# Patient Record
Sex: Male | Born: 1975 | Hispanic: No | Marital: Single | Smoking: Never smoker
Health system: Southern US, Community
[De-identification: ages and names within clinical notes are randomized; demographics above are authoritative.]

## PROBLEM LIST (undated history)

## (undated) DIAGNOSIS — T8859XA Other complications of anesthesia, initial encounter: Secondary | ICD-10-CM

## (undated) DIAGNOSIS — M1990 Unspecified osteoarthritis, unspecified site: Secondary | ICD-10-CM

## (undated) DIAGNOSIS — T4145XA Adverse effect of unspecified anesthetic, initial encounter: Secondary | ICD-10-CM

## (undated) DIAGNOSIS — K219 Gastro-esophageal reflux disease without esophagitis: Secondary | ICD-10-CM

---
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
## 2005-05-07 ENCOUNTER — Emergency Department (HOSPITAL_COMMUNITY): Admission: EM | Admit: 2005-05-07 | Discharge: 2005-05-07 | Payer: Self-pay | Admitting: Emergency Medicine

## 2007-06-18 ENCOUNTER — Emergency Department (HOSPITAL_COMMUNITY): Admission: EM | Admit: 2007-06-18 | Discharge: 2007-06-18 | Payer: Self-pay | Admitting: Emergency Medicine

## 2011-01-18 LAB — CBC
HCT: 48.8
Hemoglobin: 16.9
RBC: 5.99 — ABNORMAL HIGH
WBC: 6.9

## 2011-01-18 LAB — COMPREHENSIVE METABOLIC PANEL
AST: 24
Alkaline Phosphatase: 116
BUN: 8
CO2: 29
Chloride: 101
Creatinine, Ser: 0.9
GFR calc non Af Amer: >60
Potassium: 3.5
Total Bilirubin: 1.1

## 2011-01-18 LAB — DIFFERENTIAL
Eosinophils Relative: 1
Lymphocytes Relative: 15
Lymphs Abs: 1
Monocytes Absolute: 0.6
Neutro Abs: 5.3

## 2011-08-31 ENCOUNTER — Ambulatory Visit: Payer: Self-pay | Admitting: Physician Assistant

## 2011-08-31 VITALS — BP 120/77 | HR 69 | Temp 98.5°F | Resp 18 | Ht 70.5 in | Wt 171.0 lb

## 2011-08-31 DIAGNOSIS — Z0289 Encounter for other administrative examinations: Secondary | ICD-10-CM

## 2011-08-31 NOTE — Progress Notes (Signed)
Patient ID: Jeremiah Doyle MRN: [id number], DOB: 12-09-1975 36 y.o. Date of Encounter: 08/31/2011, 2:59 PM  Primary Physician: No primary provider on file.  Chief Complaint: DOT Physical   HPI: 36 y.o. y/o male with history noted below here for DOT physical. Doing well. No issues/complaints. Recertification. No meds. No significant PMH.   Review of Systems: Consitutional: No fever, chills, fatigue, night sweats, lymphadenopathy, or weight changes. Eyes: No visual changes, eye redness, or discharge. ENT/Mouth: Ears: No otalgia, tinnitus, hearing loss, discharge. Nose: No congestion, rhinorrhea, sinus pain, or epistaxis. Throat: No sore throat, post nasal drip, or teeth pain. Cardiovascular: No CP, palpitations, diaphoresis, DOE, edema, orthopnea, PND. Respiratory: No cough, hemoptysis, SOB, or wheezing. Gastrointestinal: No anorexia, dysphagia, reflux, pain, nausea, vomiting, hematemesis, diarrhea, constipation, BRBPR, or melena. Genitourinary: No dysuria, frequency, urgency, hematuria, incontinence, nocturia, decreased urinary stream, discharge, impotence, or testicular pain/masses. Musculoskeletal: No decreased ROM, myalgias, stiffness, joint swelling, or weakness. Skin: No rash, erythema, lesion changes, pain, warmth, jaundice, or pruritis. Neurological: No headache, dizziness, syncope, seizures, tremors, memory loss, coordination problems, or paresthesias. Psychological: No anxiety, depression, hallucinations, SI/HI. Endocrine: No fatigue, polydipsia, polyphagia, polyuria, or known diabetes. All other systems were reviewed and are otherwise negative.  History reviewed. No pertinent past medical history.   History reviewed. No pertinent past surgical history.  Home Meds:  Prior to Admission medications   Not on File    Allergies:  Allergies  Allergen Reactions  . Tramadol     History   Social History  . Marital Status: Single    Spouse Name: N/A    Number of Children: N/A   . Years of Education: N/A   Occupational History  . Not on file.   Social History Main Topics  . Smoking status: Never Smoker   . Smokeless tobacco: Never Used  . Alcohol Use: No  . Drug Use: No  . Sexually Active: Not on file   Other Topics Concern  . Not on file   Social History Narrative  . No narrative on file    No family history on file.  Physical Exam: Blood pressure 120/77, pulse 69, temperature 98.5 F (36.9 C), temperature source Oral, resp. rate 18, height 5' 10.5" (1.791 m), weight 171 lb (77.565 kg).  General: Well developed, well nourished, in no acute distress. HEENT: Normocephalic, atraumatic. Conjunctiva pink, sclera non-icteric. Pupils 2 mm constricting to 1 mm, round, regular, and equally reactive to light and accomodation. EOMI. Internal auditory canal clear. TMs with good cone of light and without pathology. Nasal mucosa pink. Nares are without discharge. No sinus tenderness. Oral mucosa pink. Dentition normal. Pharynx without exudate.   Neck: Supple. Trachea midline. No thyromegaly. Full ROM. No lymphadenopathy. Lungs: Clear to auscultation bilaterally without wheezes, rales, or rhonchi. Breathing is of normal effort and unlabored. Cardiovascular: RRR with S1 S2. No murmurs, rubs, or gallops appreciated. Distal pulses 2+ symmetrically. No carotid or abdominal bruits. Abdomen: Soft, non-tender, non-distended with normoactive bowel sounds. No hepatosplenomegaly or masses. No rebound/guarding. No CVA tenderness. Without hernias.  Genitourinary: Circumcised male. No penile lesions. Testes descended bilaterally, and smooth without tenderness or masses.  Musculoskeletal: Full range of motion and 5/5 strength throughout. Without swelling, atrophy, tenderness, crepitus, or warmth. Extremities without clubbing, cyanosis, or edema. Calves supple. Skin: Warm and moist without erythema, ecchymosis, wounds, or rash. Neuro: A+Ox3. CN II-XII grossly intact. Moves all  extremities spontaneously. Full sensation throughout. Normal gait. DTR 2+ throughout upper and lower extremities. Finger to nose intact. Psych:  Responds to questions appropriately with a normal affect.    Assessment/Plan:  36 y.o. y/o male here for DOT physical. -Cleared -2 year card issued -Form completed -RTC prn  Signed, Eula Listen, PA-C 08/31/2011 2:59 PM

## 2015-06-02 ENCOUNTER — Ambulatory Visit: Payer: Self-pay

## 2016-12-19 ENCOUNTER — Emergency Department (HOSPITAL_COMMUNITY)
Admission: EM | Admit: 2016-12-19 | Discharge: 2016-12-19 | Disposition: A | Discharge status: Home / Self Care | Payer: Worker's Compensation | Attending: Emergency Medicine | Admitting: Emergency Medicine

## 2016-12-19 ENCOUNTER — Encounter (HOSPITAL_COMMUNITY): Payer: Self-pay | Admitting: Emergency Medicine

## 2016-12-19 DIAGNOSIS — Z760 Encounter for issue of repeat prescription: Secondary | ICD-10-CM | POA: Diagnosis present

## 2016-12-19 MED ORDER — OXYCODONE HCL 5 MG PO TABS
5.0000 mg | ORAL_TABLET | Freq: Once | ORAL | Status: AC
  Administered 2016-12-19: 5 mg via ORAL
  Filled 2016-12-19: qty 1

## 2016-12-19 MED ORDER — OXYCODONE HCL 5 MG PO TABS: 5.0000 mg | ORAL_TABLET | Freq: Four times a day (QID) | ORAL | 0 refills | Status: DC | PRN

## 2016-12-19 NOTE — ED Notes (Signed)
Wounds cleaned and redressed.
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
## 2016-12-19 NOTE — Discharge Instructions (Signed)
Take pain medication as needed for severe pain. Follow-up with orthopedist for further evaluation. Continue wound care as directed. Return to ED for worsening pain, additional injury, drainage from the site, fevers.
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
## 2017-12-25 HISTORY — PX: HAND SURGERY: SHX662

## 2019-05-25 ENCOUNTER — Encounter (HOSPITAL_COMMUNITY): Payer: Self-pay | Admitting: Physician Assistant

## 2019-05-25 ENCOUNTER — Other Ambulatory Visit: Payer: Self-pay

## 2019-05-25 ENCOUNTER — Other Ambulatory Visit (HOSPITAL_COMMUNITY)
Admission: RE | Admit: 2019-05-25 | Discharge: 2019-05-25 | Disposition: A | Discharge status: Home / Self Care | Payer: Self-pay | Source: Ambulatory Visit | Attending: Orthopedic Surgery | Admitting: Orthopedic Surgery

## 2019-05-25 ENCOUNTER — Encounter (HOSPITAL_COMMUNITY): Payer: Self-pay | Admitting: Orthopedic Surgery

## 2019-05-25 DIAGNOSIS — Z01812 Encounter for preprocedural laboratory examination: Secondary | ICD-10-CM | POA: Diagnosis present

## 2019-05-25 DIAGNOSIS — U071 COVID-19: Secondary | ICD-10-CM | POA: Insufficient documentation

## 2019-05-25 LAB — SARS CORONAVIRUS 2 (TAT 6-24 HRS): SARS Coronavirus 2: POSITIVE — AB

## 2019-05-25 NOTE — Progress Notes (Addendum)
Jeremiah Doyle denies chest pain or shortness of breath. Patient will have Covid test today and then quarantine with family.  Jeremiah Doyle has seen Dr Algie Coffer in the past (3 years ago per patient) Patient has "chest pains ocasional, "but I am healthy, it probably is from acid reflux."  " I take Omeprazole and it helps most of the time." [place] reports that he has not had chesty in a "while".  I have requested records from Dr Roseanne Kaufman office.  Patient does not have a PCP.  I requested records from [facility], for records  From surgery he had there- due to patient said he had a hard time waking up.  I asked Antionette Poles, PA- C to review.
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
## 2019-05-25 NOTE — Anesthesia Preprocedure Evaluation (Deleted)
Anesthesia Evaluation Anesthesia Physical Anesthesia Plan  ASA:   Anesthesia Plan:    Post-op Pain Management:    Induction:   PONV Risk Score and Plan:   Airway Management Planned:   Additional Equipment:   Intra-op Plan:   Post-operative Plan:   Informed Consent:   Plan Discussed with:   Anesthesia Plan Comments: (PAT note written 05/25/2019 by Shonna Chock, PA-C. SAME DAY WORK-UP   )        Anesthesia Quick Evaluation

## 2019-05-26 ENCOUNTER — Other Ambulatory Visit: Payer: Self-pay | Admitting: Physician Assistant

## 2019-05-26 NOTE — Progress Notes (Addendum)
Dr. Christian Mate (taking group call for Dr. Carola Frost) notified of positive COVID test.

## 2019-05-26 NOTE — Progress Notes (Unsigned)
Received results from pre-admit regarding positive covid test.  Contacted Dr. Magdalene Patricia team made them aware.  Hardware removal case originally scheduled for this coming Mon not urgent will be cancelled and rescheduled.  Did contact pt and made them aware of positive test result, he is asymptomatic at this time.  Did give recommendations for quarantine for himself and close contacts per CDC guidelines and to contact his PCP with any further recommendations regarding retesting etc.  He will need to contact Dr. Ardeth Perfect office regarding rescheduling.  He was informed of concerning symptoms such as difficulty breathing where he should seek emergent treatment.

## 2019-05-27 NOTE — Progress Notes (Signed)
Call placed to Dr. Magdalene Patricia office regarding if this pt will be arriving for surgery tomorrow at 0530. Awaiting further instructions.

## 2019-05-27 NOTE — Progress Notes (Signed)
Per Montez Morita, PA-C for Dr. Carola Frost, the pt was informed yesterday that he tested + for covid, and that since his surgery was not an emergency, they will reschedule at a later date. OR desk made aware to remove the pt from the OR schedule for 05/28/19.

## 2019-05-28 ENCOUNTER — Encounter (HOSPITAL_COMMUNITY): Admission: RE | Payer: Self-pay | Source: Home / Self Care

## 2019-05-28 ENCOUNTER — Ambulatory Visit (HOSPITAL_COMMUNITY)
Admission: RE | Admit: 2019-05-28 | Payer: Worker's Compensation | Source: Home / Self Care | Admitting: Orthopedic Surgery

## 2019-05-28 HISTORY — DX: Other complications of anesthesia, initial encounter: T88.59XA

## 2019-05-28 HISTORY — DX: Gastro-esophageal reflux disease without esophagitis: K21.9

## 2019-05-28 HISTORY — DX: Unspecified osteoarthritis, unspecified site: M19.90

## 2019-05-28 SURGERY — REMOVAL, HARDWARE
Anesthesia: General | Laterality: Right

## 2019-06-26 ENCOUNTER — Encounter (HOSPITAL_COMMUNITY): Payer: Self-pay | Admitting: Orthopedic Surgery

## 2019-06-26 ENCOUNTER — Other Ambulatory Visit: Payer: Self-pay

## 2019-06-26 NOTE — Progress Notes (Addendum)
Jeremiah Doyle denies chest pain or shortness of breath. Patient tested positive for Covid in January, he will not need to be retested.  I instructed patient to stop Ibuprofen.

## 2019-06-28 ENCOUNTER — Ambulatory Visit (HOSPITAL_COMMUNITY): Payer: Worker's Compensation | Admitting: Certified Registered Nurse Anesthetist

## 2019-06-28 ENCOUNTER — Other Ambulatory Visit: Payer: Self-pay

## 2019-06-28 ENCOUNTER — Ambulatory Visit (HOSPITAL_COMMUNITY)
Admission: RE | Admit: 2019-06-28 | Discharge: 2019-06-28 | Disposition: A | Discharge status: Home / Self Care | Payer: Worker's Compensation | Attending: Orthopedic Surgery | Admitting: Orthopedic Surgery

## 2019-06-28 ENCOUNTER — Ambulatory Visit (HOSPITAL_COMMUNITY): Payer: Worker's Compensation | Attending: Orthopedic Surgery

## 2019-06-28 ENCOUNTER — Encounter (HOSPITAL_COMMUNITY): Payer: Self-pay | Admitting: Orthopedic Surgery

## 2019-06-28 ENCOUNTER — Encounter (HOSPITAL_COMMUNITY)
Admission: RE | Disposition: A | Discharge status: Home / Self Care | Payer: Self-pay | Source: Home / Self Care | Attending: Orthopedic Surgery

## 2019-06-28 ENCOUNTER — Ambulatory Visit (HOSPITAL_COMMUNITY): Payer: Worker's Compensation

## 2019-06-28 DIAGNOSIS — M1731 Unilateral post-traumatic osteoarthritis, right knee: Secondary | ICD-10-CM | POA: Insufficient documentation

## 2019-06-28 DIAGNOSIS — T8484XA Pain due to internal orthopedic prosthetic devices, implants and grafts, initial encounter: Secondary | ICD-10-CM | POA: Diagnosis present

## 2019-06-28 DIAGNOSIS — Y793 Surgical instruments, materials and orthopedic devices (including sutures) associated with adverse incidents: Secondary | ICD-10-CM | POA: Insufficient documentation

## 2019-06-28 DIAGNOSIS — Z885 Allergy status to narcotic agent status: Secondary | ICD-10-CM | POA: Insufficient documentation

## 2019-06-28 DIAGNOSIS — Y838 Other surgical procedures as the cause of abnormal reaction of the patient, or of later complication, without mention of misadventure at the time of the procedure: Secondary | ICD-10-CM | POA: Diagnosis not present

## 2019-06-28 DIAGNOSIS — Z419 Encounter for procedure for purposes other than remedying health state, unspecified: Secondary | ICD-10-CM

## 2019-06-28 HISTORY — PX: HARDWARE REMOVAL: SHX979

## 2019-06-28 SURGERY — REMOVAL, HARDWARE
Anesthesia: General | Laterality: Right

## 2019-06-28 MED ORDER — KETOROLAC TROMETHAMINE 30 MG/ML IJ SOLN
INTRAMUSCULAR | Status: AC
  Filled 2019-06-28: qty 1

## 2019-06-28 MED ORDER — ONDANSETRON HCL 4 MG/2ML IJ SOLN
INTRAMUSCULAR | Status: AC
  Filled 2019-06-28: qty 2

## 2019-06-28 MED ORDER — OXYCODONE HCL 5 MG/5ML PO SOLN: 5.0000 mg | Freq: Once | ORAL | Status: AC | PRN

## 2019-06-28 MED ORDER — FENTANYL CITRATE (PF) 250 MCG/5ML IJ SOLN
INTRAMUSCULAR | Status: DC | PRN
  Administered 2019-06-28: 50 ug via INTRAVENOUS
  Administered 2019-06-28: 100 ug via INTRAVENOUS
  Administered 2019-06-28 (×2): 50 ug via INTRAVENOUS

## 2019-06-28 MED ORDER — DEXAMETHASONE SODIUM PHOSPHATE 10 MG/ML IJ SOLN
INTRAMUSCULAR | Status: AC
  Filled 2019-06-28: qty 1

## 2019-06-28 MED ORDER — CEFAZOLIN SODIUM-DEXTROSE 2-4 GM/100ML-% IV SOLN
INTRAVENOUS | Status: AC
  Filled 2019-06-28: qty 100

## 2019-06-28 MED ORDER — OXYCODONE HCL 5 MG PO TABS
5.0000 mg | ORAL_TABLET | Freq: Once | ORAL | Status: AC | PRN
  Administered 2019-06-28: 5 mg via ORAL

## 2019-06-28 MED ORDER — KETOROLAC TROMETHAMINE 10 MG PO TABS: 10.0000 mg | ORAL_TABLET | Freq: Four times a day (QID) | ORAL | 0 refills | Status: AC | PRN

## 2019-06-28 MED ORDER — KETOROLAC TROMETHAMINE 30 MG/ML IJ SOLN
30.0000 mg | Freq: Once | INTRAMUSCULAR | Status: AC
  Administered 2019-06-28: 30 mg via INTRAVENOUS

## 2019-06-28 MED ORDER — CELECOXIB 200 MG PO CAPS: 400.0000 mg | ORAL_CAPSULE | Freq: Once | ORAL | Status: AC

## 2019-06-28 MED ORDER — PROPOFOL 10 MG/ML IV BOLUS
INTRAVENOUS | Status: AC
  Filled 2019-06-28: qty 20

## 2019-06-28 MED ORDER — MIDAZOLAM HCL 5 MG/5ML IJ SOLN
INTRAMUSCULAR | Status: DC | PRN
  Administered 2019-06-28: 2 mg via INTRAVENOUS

## 2019-06-28 MED ORDER — DEXAMETHASONE SODIUM PHOSPHATE 10 MG/ML IJ SOLN
INTRAMUSCULAR | Status: DC | PRN
  Administered 2019-06-28: 4 mg via INTRAVENOUS

## 2019-06-28 MED ORDER — ONDANSETRON 4 MG PO TBDP: 4.0000 mg | ORAL_TABLET | Freq: Three times a day (TID) | ORAL | 0 refills | Status: AC | PRN

## 2019-06-28 MED ORDER — 0.9 % SODIUM CHLORIDE (POUR BTL) OPTIME
TOPICAL | Status: DC | PRN
  Administered 2019-06-28: 1000 mL

## 2019-06-28 MED ORDER — GABAPENTIN 300 MG PO CAPS: 300.0000 mg | ORAL_CAPSULE | Freq: Once | ORAL | Status: AC

## 2019-06-28 MED ORDER — ONDANSETRON HCL 4 MG/2ML IJ SOLN
INTRAMUSCULAR | Status: DC | PRN
  Administered 2019-06-28: 4 mg via INTRAVENOUS

## 2019-06-28 MED ORDER — CEFAZOLIN SODIUM-DEXTROSE 2-4 GM/100ML-% IV SOLN
2.0000 g | INTRAVENOUS | Status: AC
  Administered 2019-06-28: 2 g via INTRAVENOUS

## 2019-06-28 MED ORDER — PHENYLEPHRINE 40 MCG/ML (10ML) SYRINGE FOR IV PUSH (FOR BLOOD PRESSURE SUPPORT)
PREFILLED_SYRINGE | INTRAVENOUS | Status: DC | PRN
  Administered 2019-06-28 (×2): 80 ug via INTRAVENOUS

## 2019-06-28 MED ORDER — FENTANYL CITRATE (PF) 100 MCG/2ML IJ SOLN
25.0000 ug | INTRAMUSCULAR | Status: DC | PRN
  Administered 2019-06-28: 50 ug via INTRAVENOUS
  Administered 2019-06-28 (×2): 25 ug via INTRAVENOUS

## 2019-06-28 MED ORDER — CELECOXIB 200 MG PO CAPS
ORAL_CAPSULE | ORAL | Status: AC
  Administered 2019-06-28: 400 mg via ORAL
  Filled 2019-06-28: qty 2

## 2019-06-28 MED ORDER — LACTATED RINGERS IV SOLN: INTRAVENOUS | Status: DC

## 2019-06-28 MED ORDER — GABAPENTIN 300 MG PO CAPS
ORAL_CAPSULE | ORAL | Status: AC
  Administered 2019-06-28: 300 mg via ORAL
  Filled 2019-06-28: qty 1

## 2019-06-28 MED ORDER — CHLORHEXIDINE GLUCONATE 4 % EX LIQD: 60.0000 mL | Freq: Once | CUTANEOUS | Status: DC

## 2019-06-28 MED ORDER — ROCURONIUM BROMIDE 10 MG/ML (PF) SYRINGE
PREFILLED_SYRINGE | INTRAVENOUS | Status: AC
  Filled 2019-06-28: qty 10

## 2019-06-28 MED ORDER — MIDAZOLAM HCL 2 MG/2ML IJ SOLN
INTRAMUSCULAR | Status: AC
  Filled 2019-06-28: qty 2

## 2019-06-28 MED ORDER — SUGAMMADEX SODIUM 200 MG/2ML IV SOLN
INTRAVENOUS | Status: DC | PRN
  Administered 2019-06-28: 175 mg via INTRAVENOUS

## 2019-06-28 MED ORDER — ONDANSETRON HCL 4 MG/2ML IJ SOLN: 4.0000 mg | Freq: Once | INTRAMUSCULAR | Status: DC | PRN

## 2019-06-28 MED ORDER — OXYCODONE HCL 5 MG PO TABS
ORAL_TABLET | ORAL | Status: AC
  Filled 2019-06-28: qty 1

## 2019-06-28 MED ORDER — LIDOCAINE 2% (20 MG/ML) 5 ML SYRINGE
INTRAMUSCULAR | Status: DC | PRN
  Administered 2019-06-28: 100 mg via INTRAVENOUS

## 2019-06-28 MED ORDER — FENTANYL CITRATE (PF) 100 MCG/2ML IJ SOLN
INTRAMUSCULAR | Status: AC
  Filled 2019-06-28: qty 2

## 2019-06-28 MED ORDER — LIDOCAINE 2% (20 MG/ML) 5 ML SYRINGE
INTRAMUSCULAR | Status: AC
  Filled 2019-06-28: qty 5

## 2019-06-28 MED ORDER — PHENYLEPHRINE 40 MCG/ML (10ML) SYRINGE FOR IV PUSH (FOR BLOOD PRESSURE SUPPORT)
PREFILLED_SYRINGE | INTRAVENOUS | Status: AC
  Filled 2019-06-28: qty 10

## 2019-06-28 MED ORDER — ACETAMINOPHEN 500 MG PO TABS
ORAL_TABLET | ORAL | Status: AC
  Administered 2019-06-28: 1000 mg via ORAL
  Filled 2019-06-28: qty 2

## 2019-06-28 MED ORDER — FENTANYL CITRATE (PF) 250 MCG/5ML IJ SOLN
INTRAMUSCULAR | Status: AC
  Filled 2019-06-28: qty 5

## 2019-06-28 MED ORDER — ACETAMINOPHEN 500 MG PO TABS: 1000.0000 mg | ORAL_TABLET | Freq: Once | ORAL | Status: AC

## 2019-06-28 MED ORDER — HYDROCODONE-ACETAMINOPHEN 5-325 MG PO TABS: 1.0000 | ORAL_TABLET | Freq: Four times a day (QID) | ORAL | 0 refills | Status: AC | PRN

## 2019-06-28 MED ORDER — PROPOFOL 10 MG/ML IV BOLUS
INTRAVENOUS | Status: DC | PRN
  Administered 2019-06-28: 200 mg via INTRAVENOUS

## 2019-06-28 MED ORDER — ROCURONIUM BROMIDE 10 MG/ML (PF) SYRINGE
PREFILLED_SYRINGE | INTRAVENOUS | Status: DC | PRN
  Administered 2019-06-28: 50 mg via INTRAVENOUS
  Administered 2019-06-28 (×2): 10 mg via INTRAVENOUS

## 2019-06-28 SURGICAL SUPPLY — 65 items
BANDAGE ESMARK 6X9 LF (GAUZE/BANDAGES/DRESSINGS) ×1 IMPLANT
BNDG CMPR 9X6 STRL LF SNTH (GAUZE/BANDAGES/DRESSINGS) ×1
BNDG COHESIVE 6X5 TAN STRL LF (GAUZE/BANDAGES/DRESSINGS) ×3 IMPLANT
BNDG ELASTIC 4X5.8 VLCR STR LF (GAUZE/BANDAGES/DRESSINGS) ×3 IMPLANT
BNDG ELASTIC 6X5.8 VLCR STR LF (GAUZE/BANDAGES/DRESSINGS) ×3 IMPLANT
BNDG ESMARK 6X9 LF (GAUZE/BANDAGES/DRESSINGS) ×3
BNDG GAUZE ELAST 4 BULKY (GAUZE/BANDAGES/DRESSINGS) ×6 IMPLANT
BRUSH SCRUB EZ PLAIN DRY (MISCELLANEOUS) ×6 IMPLANT
CLOSURE WOUND 1/2 X4 (GAUZE/BANDAGES/DRESSINGS)
COVER SURGICAL LIGHT HANDLE (MISCELLANEOUS) ×6 IMPLANT
COVER WAND RF STERILE (DRAPES) ×3 IMPLANT
CUFF TOURN SGL QUICK 18X4 (TOURNIQUET CUFF) IMPLANT
CUFF TOURN SGL QUICK 24 (TOURNIQUET CUFF)
CUFF TOURN SGL QUICK 34 (TOURNIQUET CUFF)
CUFF TRNQT CYL 24X4X16.5-23 (TOURNIQUET CUFF) IMPLANT
CUFF TRNQT CYL 34X4.125X (TOURNIQUET CUFF) IMPLANT
DRAPE C-ARM 42X72 X-RAY (DRAPES) IMPLANT
DRAPE C-ARMOR (DRAPES) ×3 IMPLANT
DRAPE U-SHAPE 47X51 STRL (DRAPES) ×3 IMPLANT
DRSG ADAPTIC 3X8 NADH LF (GAUZE/BANDAGES/DRESSINGS) ×3 IMPLANT
DRSG MEPILEX BORDER 4X12 (GAUZE/BANDAGES/DRESSINGS) ×2 IMPLANT
ELECT REM PT RETURN 9FT ADLT (ELECTROSURGICAL) ×3
ELECTRODE REM PT RTRN 9FT ADLT (ELECTROSURGICAL) ×1 IMPLANT
GAUZE SPONGE 4X4 12PLY STRL (GAUZE/BANDAGES/DRESSINGS) ×3 IMPLANT
GLOVE BIO SURGEON STRL SZ7.5 (GLOVE) ×3 IMPLANT
GLOVE BIO SURGEON STRL SZ8 (GLOVE) ×3 IMPLANT
GLOVE BIOGEL PI IND STRL 7.5 (GLOVE) ×1 IMPLANT
GLOVE BIOGEL PI IND STRL 8 (GLOVE) ×1 IMPLANT
GLOVE BIOGEL PI INDICATOR 7.5 (GLOVE) ×2
GLOVE BIOGEL PI INDICATOR 8 (GLOVE) ×2
GOWN STRL REUS W/ TWL LRG LVL3 (GOWN DISPOSABLE) ×2 IMPLANT
GOWN STRL REUS W/ TWL XL LVL3 (GOWN DISPOSABLE) ×1 IMPLANT
GOWN STRL REUS W/TWL LRG LVL3 (GOWN DISPOSABLE) ×6
GOWN STRL REUS W/TWL XL LVL3 (GOWN DISPOSABLE) ×3
KIT BASIN OR (CUSTOM PROCEDURE TRAY) ×3 IMPLANT
KIT TURNOVER KIT B (KITS) ×3 IMPLANT
MANIFOLD NEPTUNE II (INSTRUMENTS) ×3 IMPLANT
NEEDLE 22X1 1/2 (OR ONLY) (NEEDLE) IMPLANT
NS IRRIG 1000ML POUR BTL (IV SOLUTION) ×3 IMPLANT
PACK ORTHO EXTREMITY (CUSTOM PROCEDURE TRAY) ×3 IMPLANT
PAD ARMBOARD 7.5X6 YLW CONV (MISCELLANEOUS) ×6 IMPLANT
PADDING CAST COTTON 6X4 STRL (CAST SUPPLIES) ×9 IMPLANT
SPONGE LAP 18X18 RF (DISPOSABLE) ×3 IMPLANT
STAPLER VISISTAT 35W (STAPLE) IMPLANT
STOCKINETTE IMPERVIOUS LG (DRAPES) ×3 IMPLANT
STRIP CLOSURE SKIN 1/2X4 (GAUZE/BANDAGES/DRESSINGS) IMPLANT
SUCTION FRAZIER HANDLE 10FR (MISCELLANEOUS)
SUCTION TUBE FRAZIER 10FR DISP (MISCELLANEOUS) IMPLANT
SUT ETHILON 2 0 PSLX (SUTURE) ×4 IMPLANT
SUT ETHILON 3 0 PS 1 (SUTURE) IMPLANT
SUT PDS AB 2-0 CT1 27 (SUTURE) IMPLANT
SUT VIC AB 0 CT1 27 (SUTURE) ×3
SUT VIC AB 0 CT1 27XBRD ANBCTR (SUTURE) IMPLANT
SUT VIC AB 1 CT1 36 (SUTURE) ×2 IMPLANT
SUT VIC AB 2-0 CT1 (SUTURE) ×2 IMPLANT
SUT VIC AB 2-0 CT1 27 (SUTURE) ×3
SUT VIC AB 2-0 CT1 TAPERPNT 27 (SUTURE) IMPLANT
SYR CONTROL 10ML LL (SYRINGE) IMPLANT
TOWEL GREEN STERILE (TOWEL DISPOSABLE) ×6 IMPLANT
TOWEL GREEN STERILE FF (TOWEL DISPOSABLE) ×6 IMPLANT
TUBE CONNECTING 12'X1/4 (SUCTIONS) ×1
TUBE CONNECTING 12X1/4 (SUCTIONS) ×2 IMPLANT
UNDERPAD 30X30 (UNDERPADS AND DIAPERS) ×3 IMPLANT
WATER STERILE IRR 1000ML POUR (IV SOLUTION) ×6 IMPLANT
YANKAUER SUCT BULB TIP NO VENT (SUCTIONS) ×3 IMPLANT

## 2019-06-28 NOTE — Progress Notes (Signed)
Orthopedic Tech Progress Note Patient Details:  Jeremiah Doyle 1976/04/03 [id number] PACU RN called requesting CRUTCHES for patient Ortho Devices Type of Ortho Device: Crutches   Post Interventions Patient Tolerated: Ambulated well, Well Instructions Provided: Poper ambulation with device, Care of device, Adjustment of device   [name] 06/28/2019, 3:34 PM

## 2019-06-28 NOTE — OR Nursing (Signed)
Explants: One plate and ten screws removed from right femur. Hardware was soaked in hydrogen peroxide and cleaned with purple wipes. As requested, hardware given to patient per surgeon.

## 2019-06-28 NOTE — Transfer of Care (Signed)
Immediate Anesthesia Transfer of Care Note  Patient: Jeremiah Doyle  Procedure(s) Performed: HARDWARE REMOVAL DISTAL FEMUR (Right )  Patient Location: PACU  Anesthesia Type:General  Level of Consciousness: drowsy  Airway & Oxygen Therapy: Patient Spontanous Breathing and Patient connected to nasal cannula oxygen  Post-op Assessment: Report given to RN and Post -op Vital signs reviewed and stable  Post vital signs: Reviewed and stable  Last Vitals:  Vitals Value Taken Time  BP 125/92 06/28/19 1339  Temp    Pulse 63 06/28/19 1341  Resp 14 06/28/19 1341  SpO2 100 % 06/28/19 1341  Vitals shown include unvalidated device data.  Last Pain:  Vitals:   06/28/19 0850  TempSrc:   PainSc: 0-No pain      Patients Stated Pain Goal: 3 (06/28/19 0850)  Complications: No apparent anesthesia complications

## 2019-06-28 NOTE — Anesthesia Postprocedure Evaluation (Signed)
Anesthesia Post Note  Patient: Jeremiah Doyle  Procedure(s) Performed: HARDWARE REMOVAL DISTAL FEMUR (Right )     Patient location during evaluation: PACU Anesthesia Type: General Level of consciousness: awake and alert Pain management: pain level controlled Vital Signs Assessment: post-procedure vital signs reviewed and stable Respiratory status: spontaneous breathing, nonlabored ventilation and respiratory function stable Cardiovascular status: blood pressure returned to baseline and stable Postop Assessment: no apparent nausea or vomiting Anesthetic complications: no    Last Vitals:  Vitals:   06/28/19 1410 06/28/19 1425  BP: (!) 131/99 (!) 135/93  Pulse: 65 73  Resp: 14 14  Temp:  36.4 C  SpO2: 98% 98%    Last Pain:  Vitals:   06/28/19 1425  TempSrc:   PainSc: Asleep                 Lucretia Kern

## 2019-06-28 NOTE — H&P (Signed)
Orthopaedic Trauma Service (OTS) Consult   Patient ID: Jeremiah Doyle MRN: [id number] DOB/AGE: 1975/11/11 44 y.o.   HPI: Jeremiah Doyle is an 44 y.o. male s/p ORIF R distal femur at an outside institution. Has been following up with OTS.  Presents today for [facility] due to symptomatic hardware and DJD R knee   Past Medical History:  Diagnosis Date  . Arthritis   . Complication of anesthesia    slow to awaken 2019 - "I had not sleep well, they said that is probably was the reason"- at [facility]  . GERD (gastroesophageal reflux disease)     Past Surgical History:  Procedure Laterality Date  . APPENDECTOMY  1998  . Femur fraction Right   . HAND SURGERY Right 12/2017   Debridment of scar tissue  . SHOULDER SURGERY Right 2009   fracture    No family history on file.  Social History:  reports that he has never smoked. He has never used smokeless tobacco. He reports that he does not drink alcohol or use drugs.  Allergies:  Allergies  Allergen Reactions  . Tramadol     Bloody stool    Medications: I have reviewed the patient's current medications. Current Meds  Medication Sig  . acetaminophen (TYLENOL) 500 MG tablet Take 500 mg by mouth every 6 (six) hours as needed for moderate pain or headache.  . ibuprofen (ADVIL) 200 MG tablet Take 200 mg by mouth every 6 (six) hours as needed for headache or moderate pain.     No results found for this or any previous visit (from the past 48 hour(s)).  No results found.  Review of Systems  Constitutional: Negative.   HENT: Negative.   Eyes: Negative.   Respiratory: Negative.   Cardiovascular: Negative.   Gastrointestinal: Negative.   Genitourinary: Negative.   Musculoskeletal:       Chronic R knee pain   Skin: Negative.   Neurological: Negative for tingling and sensory change.  Endo/Heme/Allergies: Negative.    Height 5\' 10"  (1.778 m), weight 86.2 kg. Physical Exam Constitutional:      Appearance:  Normal appearance.  Cardiovascular:     Rate and Rhythm: Normal rate and regular rhythm.  Pulmonary:     Effort: Pulmonary effort is normal.     Breath sounds: Normal breath sounds.  Musculoskeletal:     Comments: Right Lower Extremity  Midline surgical wound healed + joint line tenderness No tenderness over fracture site Distal motor and sensory functions intact No DCT + DP pulse No swelling   Skin:    General: Skin is warm.     Capillary Refill: Capillary refill takes less than 2 seconds.  Neurological:     Mental Status: He is alert.    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

## 2019-06-28 NOTE — Discharge Instructions (Addendum)
[facility] Discharge Instructions   General Discharge Instructions   WEIGHT BEARING STATUS: Weightbearing as tolerated   RANGE OF MOTION/ACTIVITY: unrestricted range of motion R knee and hip, activity as tolerated. Slowly increase activity   Wound Care: daily wound care starting on 06/30/2019. See below  Discharge Wound Care Instructions  Do NOT apply any ointments, solutions or lotions to pin sites or surgical wounds.  These prevent needed drainage and even though solutions like hydrogen peroxide kill bacteria, they also damage cells lining the pin sites that help fight infection.  Applying lotions or ointments can keep the wounds moist and can cause them to breakdown and open up as well. This can increase the risk for infection. When in doubt call the office.  Surgical incisions should be dressed daily starting 06/30/2019  If any drainage is noted, use one layer of adaptic, then gauze, Kerlix, and an ace wrap.  Once the incision is completely dry and without drainage, it may be left open to air out.  Showering may begin 36-48 hours later.  Cleaning gently with soap and water.   Diet: as you were eating previously.  Can use over the counter stool softeners and bowel preparations, such as Miralax, to help with bowel movements.  Narcotics can be constipating.  Be sure to drink plenty of fluids  PAIN MEDICATION USE AND EXPECTATIONS  You have likely been given narcotic medications to help control your pain.  After a traumatic event that results in an fracture (broken bone) with or without surgery, it is ok to use narcotic pain medications to help control one's pain.  We understand that everyone responds to pain differently and each individual patient will be evaluated on a regular basis for the continued need for narcotic medications. Ideally, narcotic medication use should last no more than 6-8 weeks (coinciding with fracture healing).   As a patient it is your responsibility  as well to monitor narcotic medication use and report the amount and frequency you use these medications when you come to your office visit.   We would also advise that if you are using narcotic medications, you should take a dose prior to therapy to maximize you participation.  IF YOU ARE ON NARCOTIC MEDICATIONS IT IS NOT PERMISSIBLE TO OPERATE A MOTOR VEHICLE (MOTORCYCLE/CAR/TRUCK/MOPED) OR HEAVY MACHINERY DO NOT MIX NARCOTICS WITH OTHER CNS (CENTRAL NERVOUS SYSTEM) DEPRESSANTS SUCH AS ALCOHOL   STOP SMOKING OR USING NICOTINE PRODUCTS!!!!  As discussed nicotine severely impairs your body's ability to heal surgical and traumatic wounds but also impairs bone healing.  Wounds and bone heal by forming microscopic blood vessels (angiogenesis) and nicotine is a vasoconstrictor (essentially, shrinks blood vessels).  Therefore, if vasoconstriction occurs to these microscopic blood vessels they essentially disappear and are unable to deliver necessary nutrients to the healing tissue.  This is one modifiable factor that you can do to dramatically increase your chances of healing your injury.    (This means no smoking, no nicotine gum, patches, etc)     ICE AND ELEVATE INJURED/OPERATIVE EXTREMITY  Using ice and elevating the injured extremity above your heart can help with swelling and pain control.  Icing in a pulsatile fashion, such as 20 minutes on and 20 minutes off, can be followed.    Do not place ice directly on skin. Make sure there is a barrier between to skin and the ice pack.    Using frozen items such as frozen peas works well as the conform nicely to  the are that needs to be iced.  USE AN ACE WRAP OR TED HOSE FOR SWELLING CONTROL  In addition to icing and elevation, Ace wraps or TED hose are used to help limit and resolve swelling.  It is recommended to use Ace wraps or TED hose until you are informed to stop.    When using Ace Wraps start the wrapping distally (farthest away from the body)  and wrap proximally (closer to the body)   Example: If you had surgery on your leg or thing and you do not have a splint on, start the ace wrap at the toes and work your way up to the thigh        If you had surgery on your upper extremity and do not have a splint on, start the ace wrap at your fingers and work your way up to the upper arm  IF YOU ARE IN A SPLINT OR CAST DO NOT [place]   If your splint gets wet for any reason please contact the office immediately. You may shower in your splint or cast as long as you keep it dry.  This can be done by wrapping in a cast cover or garbage back (or similar)  Do Not stick any thing down your splint or cast such as pencils, money, or hangers to try and scratch yourself with.  If you feel itchy take benadryl as prescribed on the bottle for itching  IF YOU ARE IN A CAM BOOT (BLACK BOOT)  You may remove boot periodically. Perform daily dressing changes as noted below.  Wash the liner of the boot regularly and wear a sock when wearing the boot. It is recommended that you sleep in the boot until told otherwise    Call office for the following:  Temperature greater than 101F  Persistent nausea and vomiting  Severe uncontrolled pain  Redness, tenderness, or signs of infection (pain, swelling, redness, odor or green/yellow discharge around the site)  Difficulty breathing, headache or visual disturbances  Hives  Persistent dizziness or light-headedness  Extreme fatigue  Any other questions or concerns you may have after discharge  In an emergency, call 911 or go to an Emergency Department at a nearby hospital    [place]: [phone]   VISIT OUR WEBSITE FOR ADDITIONAL INFORMATION: [email or web address]

## 2019-06-28 NOTE — Op Note (Signed)
06/28/2019  1:27 PM  PATIENT:  Jeremiah Doyle  44 y.o. male  PRE-OPERATIVE DIAGNOSIS:  Symptomatic Hardware Right Distal Femur  POST-OPERATIVE DIAGNOSIS:  Symptomatic Hardware Right Distal Femur  PROCEDURE:  Procedure(s): HARDWARE REMOVAL DISTAL FEMUR (Right)  SURGEON:  Surgeon(s) and Role:    Myrene Galas, MD - Primary  PHYSICIAN ASSISTANT: Montez Morita, PA-C  ANESTHESIA:   general  I/O:  Total I/O In: 1100 [I.V.:1000; IV Piggyback:100] Out: 25 [Blood:25]  SPECIMEN:  No Specimen  TOURNIQUET:  * No tourniquets in log *  COMPLICATIONS: NONE  DICTATION: .Note written in EPIC  DISPOSITION: TO PACU  CONDITION: STABLE  DELAY START OF DVT PROPHYLAXIS BECAUSE OF BLEEDING RISK: NO   BRIEF SUMMARY OF INDICATION FOR PROCEDURE:  Patient is a pleasant 44 y.o. who underwent plate fixation of a right femur fracture with subsequent healing. Despite conservative measures, hardware related symptoms have persisted. Therefore, I discussed with patient the risks and benefits of surgical removal including infection, nerve or vessel injury, failure to alleviate symptoms, occult nonunion, re-fracture, DVT, PE, loss of motion, and multiple others. After acknowledging these risks, consent was provided to proceed.  BRIEF SUMMARY OF PROCEDURE:  The patient was taken to the operating room after administration of 2 g of Ancef.  General anesthesia was induced. The right lower extremity was prepped and draped in usual sterile fashion.  No tourniquet was used during the procedure.  C-arm was brought in to confirm position of the hardware.  I made a new lateral incision rather than used the old "swashbuckler's approach" which had produced so much scarring and difficulty with motion. Proximally I connected two of the old  incisions and dissected sharply down to the plate, elevating the soft tissues. I identified and removed all screws within the plate and two buried screws that were accessible  laterally. I did not make a separate medial incision as these screws were buried and flush with the bone and because of the patient's struggles with scarring and loss of motion. I placed a Cobb over top of the plate and underneath with the sharp edge away from the periosteum to generate some mobility as there were small areas of bone overgrowth and extensive soft tissue connections down to the plate.  The plate was gently rocked to assist with this and then extracted atraumatically. Final x-rays confirmed removal of all hardware and a healed fracture. The wounds were irrigated thoroughly and closed in standard fashion with vicryl and nylon. A sterile gently compressive dressing was applied.  The patient was taken to the PACU in stable condition.  Montez Morita, PA-C, and a PA student assisted me throughout.  PROGNOSIS: Patient will be weightbearing as tolerated with aggressive active and passive motion of the knee and hip. Bleeding would be anticipated. He may change or remove his dressing in 48 hours and shower. Patient will follow up in 10 days for removal of sutures.    Doralee Albino. Carola Frost, M.D.

## 2019-06-28 NOTE — Anesthesia Preprocedure Evaluation (Addendum)
Anesthesia Evaluation  Patient identified by MRN, date of birth, ID band Patient awake    Reviewed: Allergy & Precautions, NPO status , Patient's Chart, lab work & pertinent test results  History of Anesthesia Complications Negative for: history of anesthetic complications  Airway Mallampati: II  TM Distance: >3 FB Neck ROM: Full    Dental  (+) Teeth Intact   Pulmonary neg pulmonary ROS,    Pulmonary exam normal        Cardiovascular negative cardio ROS Normal cardiovascular exam     Neuro/Psych negative neurological ROS  negative psych ROS   GI/Hepatic Neg liver ROS, GERD  ,  Endo/Other  negative endocrine ROS  Renal/GU negative Renal ROS  negative genitourinary   Musculoskeletal negative musculoskeletal ROS (+)   Abdominal   Peds  Hematology negative hematology ROS (+)   Anesthesia Other Findings COVID-19 positive 05/25/19  Reproductive/Obstetrics                            Anesthesia Physical Anesthesia Plan  ASA: II  Anesthesia Plan: General   Post-op Pain Management:    Induction: Intravenous  PONV Risk Score and Plan: 2 and Ondansetron, Dexamethasone, Midazolam and Treatment may vary due to age or medical condition  Airway Management Planned: Oral ETT  Additional Equipment: None  Intra-op Plan:   Post-operative Plan: Extubation in OR  Informed Consent: I have reviewed the patients History and Physical, chart, labs and discussed the procedure including the risks, benefits and alternatives for the proposed anesthesia with the patient or authorized representative who has indicated his/her understanding and acceptance.     Dental advisory given  Plan Discussed with:   Anesthesia Plan Comments:       Anesthesia Quick Evaluation

## 2019-06-28 NOTE — Anesthesia Procedure Notes (Signed)
Procedure Name: Intubation Date/Time: 06/28/2019 11:09 AM Performed by: Waynard Edwards, CRNA Pre-anesthesia Checklist: Patient identified, Emergency Drugs available, Suction available and Patient being monitored Patient Re-evaluated:Patient Re-evaluated prior to induction Oxygen Delivery Method: Circle System Utilized Preoxygenation: Pre-oxygenation with 100% oxygen Induction Type: IV induction Ventilation: Mask ventilation without difficulty and Oral airway inserted - appropriate to patient size Laryngoscope Size: Hyacinth Meeker and 2 Grade View: Grade I Tube type: Oral Tube size: 7.5 mm Number of attempts: 1 Airway Equipment and Method: Stylet and Oral airway Placement Confirmation: ETT inserted through vocal cords under direct vision,  positive ETCO2 and breath sounds checked- equal and bilateral Secured at: 22 cm Tube secured with: Tape Dental Injury: Teeth and Oropharynx as per pre-operative assessment

## 2019-07-01 ENCOUNTER — Emergency Department (HOSPITAL_COMMUNITY)
Admission: EM | Admit: 2019-07-01 | Discharge: 2019-07-01 | Disposition: A | Discharge status: Home / Self Care | Payer: Worker's Compensation | Attending: Emergency Medicine | Admitting: Emergency Medicine

## 2019-07-01 ENCOUNTER — Other Ambulatory Visit: Payer: Self-pay

## 2019-07-01 ENCOUNTER — Encounter (HOSPITAL_COMMUNITY): Payer: Self-pay | Admitting: Emergency Medicine

## 2019-07-01 ENCOUNTER — Emergency Department (HOSPITAL_COMMUNITY): Payer: Self-pay | Attending: Emergency Medicine

## 2019-07-01 DIAGNOSIS — M25561 Pain in right knee: Secondary | ICD-10-CM | POA: Diagnosis present

## 2019-07-01 DIAGNOSIS — G8918 Other acute postprocedural pain: Secondary | ICD-10-CM | POA: Insufficient documentation

## 2019-07-01 LAB — COMPREHENSIVE METABOLIC PANEL
ALT: 23 U/L (ref 0–44)
AST: 21 U/L (ref 15–41)
Albumin: 3.7 g/dL (ref 3.5–5.0)
Alkaline Phosphatase: 100 U/L (ref 38–126)
Anion gap: 10 (ref 5–15)
BUN: 10 mg/dL (ref 6–20)
CO2: 26 mmol/L (ref 22–32)
Calcium: 9.2 mg/dL (ref 8.9–10.3)
Chloride: 101 mmol/L (ref 98–111)
Creatinine, Ser: 0.84 mg/dL (ref 0.61–1.24)
GFR calc Af Amer: >60 mL/min (ref >60)
GFR calc non Af Amer: >60 mL/min (ref >60)
Glucose, Bld: 123 mg/dL — ABNORMAL HIGH (ref 70–99)
Potassium: 3.6 mmol/L (ref 3.5–5.1)
Sodium: 137 mmol/L (ref 135–145)
Total Bilirubin: 0.6 mg/dL (ref 0.3–1.2)
Total Protein: 7.9 g/dL (ref 6.5–8.1)

## 2019-07-01 LAB — CBC WITH DIFFERENTIAL/PLATELET
Abs Immature Granulocytes: 0.05 10*3/uL (ref 0.00–0.07)
Basophils Absolute: 0 10*3/uL (ref 0.0–0.1)
Basophils Relative: 0 %
Eosinophils Absolute: 0.1 10*3/uL (ref 0.0–0.5)
Eosinophils Relative: 1 %
HCT: 47.2 % (ref 39.0–52.0)
Hemoglobin: 15.5 g/dL (ref 13.0–17.0)
Immature Granulocytes: 1 %
Lymphocytes Relative: 28 %
Lymphs Abs: 2.7 10*3/uL (ref 0.7–4.0)
MCH: 26.8 pg (ref 26.0–34.0)
MCHC: 32.8 g/dL (ref 30.0–36.0)
MCV: 81.7 fL (ref 80.0–100.0)
Monocytes Absolute: 0.8 10*3/uL (ref 0.1–1.0)
Monocytes Relative: 9 %
Neutro Abs: 5.8 10*3/uL (ref 1.7–7.7)
Neutrophils Relative %: 61 %
Platelets: 328 10*3/uL (ref 150–400)
RBC: 5.78 MIL/uL (ref 4.22–5.81)
RDW: 12.7 % (ref 11.5–15.5)
WBC: 9.5 10*3/uL (ref 4.0–10.5)
nRBC: 0 % (ref 0.0–0.2)

## 2019-07-01 NOTE — Discharge Instructions (Addendum)
Call your orthopedic physician's office tomorrow to arrange close follow-up.  Please return to the ED if you develop worsening symptoms including fever.

## 2019-07-01 NOTE — ED Provider Notes (Signed)
[facility] EMERGENCY DEPARTMENT Provider Note   CSN: [id number] Arrival date & time: 07/01/19  1154     History Chief Complaint  Patient presents with  . Post-op Problem    Jeremiah Doyle is a 44 y.o. male.  Patient had surgery three days ago to remove hardware from right femur, increase swelling to knee and pain. No fever, no purulent drainage.   The history is provided by the patient.  Knee Pain Location:  Knee Time since incident:  3 days Injury: no   Knee location:  R knee Pain details:    Quality:  Aching   Radiates to:  Does not radiate   Severity:  Mild   Onset quality:  Gradual   Timing:  Constant   Progression:  Unchanged Chronicity:  New Relieved by: narcotics. Worsened by:  Activity Associated symptoms: decreased ROM and swelling   Associated symptoms: no back pain, no fatigue and no fever        Past Medical History:  Diagnosis Date  . Arthritis   . Complication of anesthesia    slow to awaken 2019 - "I had not sleep well, they said that is probably was the reason"- at [facility]  . GERD (gastroesophageal reflux disease)     There are no problems to display for this patient.   Past Surgical History:  Procedure Laterality Date  . APPENDECTOMY  1998  . Femur fraction Right   . HAND SURGERY Right 12/2017   Debridment of scar tissue  . HARDWARE REMOVAL Right 06/28/2019   Procedure: HARDWARE REMOVAL DISTAL FEMUR;  Surgeon: Myrene Galas, MD;  Location: [facility] OR;  Service: Orthopedics;  Laterality: Right;  . SHOULDER SURGERY Right 2009   fracture       No family history on file.  Social History   Tobacco Use  . Smoking status: Never Smoker  . Smokeless tobacco: Never Used  Substance Use Topics  . Alcohol use: No  . Drug use: No    Home Medications Prior to Admission medications   Medication Sig Start Date End Date Taking? Authorizing Provider  acetaminophen (TYLENOL) 500 MG tablet Take 500 mg by mouth  every 6 (six) hours as needed for moderate pain or headache.    [provider]  HYDROcodone-acetaminophen (NORCO) 5-325 MG tablet Take 1 tablet by mouth every 6 (six) hours as needed for moderate pain. 06/28/19   Montez Morita, PA-C  ketorolac (TORADOL) 10 MG tablet Take 1 tablet (10 mg total) by mouth every 6 (six) hours as needed for moderate pain. 06/28/19   Montez Morita, PA-C  ondansetron (ZOFRAN ODT) 4 MG disintegrating tablet Take 1 tablet (4 mg total) by mouth every 8 (eight) hours as needed for nausea or vomiting. 06/28/19   Montez Morita, PA-C    Allergies    Tramadol  Review of Systems   Review of Systems  Constitutional: Negative for chills, fatigue and fever.  HENT: Negative for ear pain and sore throat.   Eyes: Negative for pain and visual disturbance.  Respiratory: Negative for cough and shortness of breath.   Cardiovascular: Negative for chest pain and palpitations.  Gastrointestinal: Negative for abdominal pain and vomiting.  Genitourinary: Negative for dysuria and hematuria.  Musculoskeletal: Positive for joint swelling. Negative for arthralgias and back pain.  Skin: Negative for color change and rash.  Neurological: Negative for seizures and syncope.  All other systems reviewed and are negative.   Physical Exam Updated Vital Signs  ED Triage Vitals  Enc Vitals Group     BP 07/01/19 1158 124/90     Pulse Rate 07/01/19 1158 91     Resp 07/01/19 1158 20     Temp 07/01/19 1158 98.3 F (36.8 C)     Temp Source 07/01/19 1158 Oral     SpO2 07/01/19 1158 100 %     Weight --      Height --      Head Circumference --      Peak Flow --      Pain Score 07/01/19 1226 6     Pain Loc --      Pain Edu? --      Excl. in [place]? --     Physical Exam Vitals and nursing note reviewed.  Constitutional:      General: He is not in acute distress.    Appearance: He is well-developed. He is not ill-appearing.  HENT:     Head: Normocephalic and atraumatic.     Nose: Nose  normal.     Mouth/Throat:     Mouth: Mucous membranes are moist.  Eyes:     Conjunctiva/sclera: Conjunctivae normal.     Pupils: Pupils are equal, round, and reactive to light.  Cardiovascular:     Rate and Rhythm: Normal rate and regular rhythm.     Pulses: Normal pulses.     Heart sounds: Normal heart sounds. No murmur.  Pulmonary:     Effort: Pulmonary effort is normal. No respiratory distress.     Breath sounds: Normal breath sounds.  Abdominal:     Palpations: Abdomen is soft.     Tenderness: There is no abdominal tenderness.  Musculoskeletal:        General: Swelling (right knee) and tenderness present.     Cervical back: Neck supple.     Comments: No posterior leg tenderness, limited ROM of right LE at knee due to pain/swelling  Skin:    General: Skin is warm and dry.     Capillary Refill: Capillary refill takes less than 2 seconds.     Findings: No erythema (no warmth or redness to RLE).     Comments: Surgical site c/d/i  Neurological:     Mental Status: He is alert.     ED Results / Procedures / Treatments   Labs (all labs ordered are listed, but only abnormal results are displayed) Labs Reviewed  COMPREHENSIVE METABOLIC PANEL - Abnormal; Notable for the following components:      Result Value   Glucose, Bld 123 (*)    All other components within normal limits  CBC WITH DIFFERENTIAL/PLATELET    EKG None  Radiology DG Knee Complete 4 Views Right  Result Date: 07/01/2019 CLINICAL DATA:  Pt states he had hardware removed from his femur 3 days ago and is having more swelling and pain than anticipated. Pt states his knee and leg are swollen and painful from the knee up to mid thigh. EXAM: RIGHT KNEE - COMPLETE 4+ VIEW COMPARISON:  Right knee radiographs 06/28/2019 she FINDINGS: Two screws are again noted in the medial femoral condyle. Old screw tracks noted in the distal femur. Increased swelling noted in the right lateral soft tissues. Minimal amount of  postoperative gas remains. There is no evidence of acute fracture or dislocation. Trace joint effusion. IMPRESSION: Increased swelling in the right lateral thigh soft tissues with minimal amount of postoperative gas remaining. No acute osseous abnormality identified. Electronically Signed   By: Audie Pinto M.D.   On:  07/01/2019 13:53    Procedures Procedures (including critical care time)  Medications Ordered in ED Medications - No data to display  ED Course  I have reviewed the triage vital signs and the nursing notes.  Pertinent labs & imaging results that were available during my care of the patient were reviewed by me and considered in my medical decision making (see chart for details).    MDM Rules/Calculators/A&P                      Jeremiah Doyle is a 44 year old male with right knee pain.  Had surgery several days ago to remove plate from his right femur.  Normal vitals.  No fever.  Having increasing pain at the surgical site.  Overall surgical site is clean, dry, intact.  There is no erythema.  There is no warmth.  Patient has no fever.  No leukocytosis.  X-ray shows some increased swelling around surgical wound site but likely expected given recent surgery.  No concern for infectious process at this time.  No concern for DVT as there is no calf tenderness.  Pain is mostly anterior right thigh and around the knee which is mildly swollen.  Low concern for septic process.  Good pulses in lower extremities. Talked with Dr. Everardo Pacific on the phone and they recommend that patient call to follow-up with Dr. Carola Frost sooner.  He was given return precautions and discharged from the ED in good condition.  This chart was dictated using voice recognition software.  Despite best efforts to proofread,  errors can occur which can change the documentation meaning.    Final Clinical Impression(s) / ED Diagnoses Final diagnoses:  Post-operative pain    Rx / DC Orders ED Discharge Orders    None         Virgina Norfolk, DO 07/01/19 1514

## 2019-07-01 NOTE — ED Notes (Signed)
Pt transported to xray 

## 2019-07-01 NOTE — ED Triage Notes (Signed)
Pt had R knee surgery 3 days ago, states more drainage on the bandage in the last day and more swelling and pain to his knee. Reports he took prescribed pain meds which helped but wants to make sure the incision from surgery looks good. Denies fever or chills.

## 2020-07-16 IMAGING — RF DG KNEE 1-2V*R*
1 series · 2 of 2 positions shown · IV contrast (agent unspecified)
Comparison: No prior.

CLINICAL DATA: Hardware removal.

EXAM:
DG C-ARM 1-60 MIN; RIGHT KNEE - 1-2 VIEW
CONTRAST:  None.
FLUOROSCOPY TIME:  Fluoroscopy Time:  0 minutes 20 seconds
Number of Acquired Spot Images: 2

[Series 1: run · 2 of 2 slices shown]
[im 1/2]
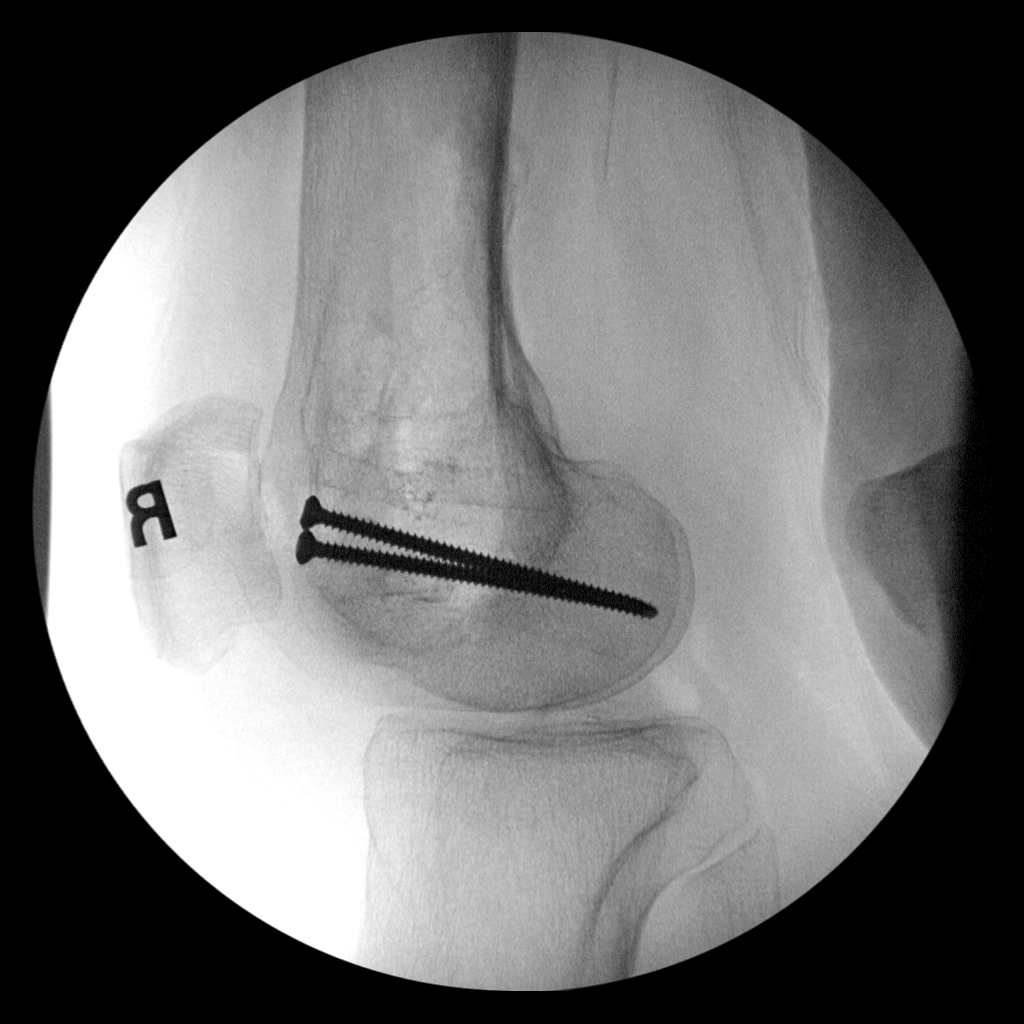
[im 2/2]
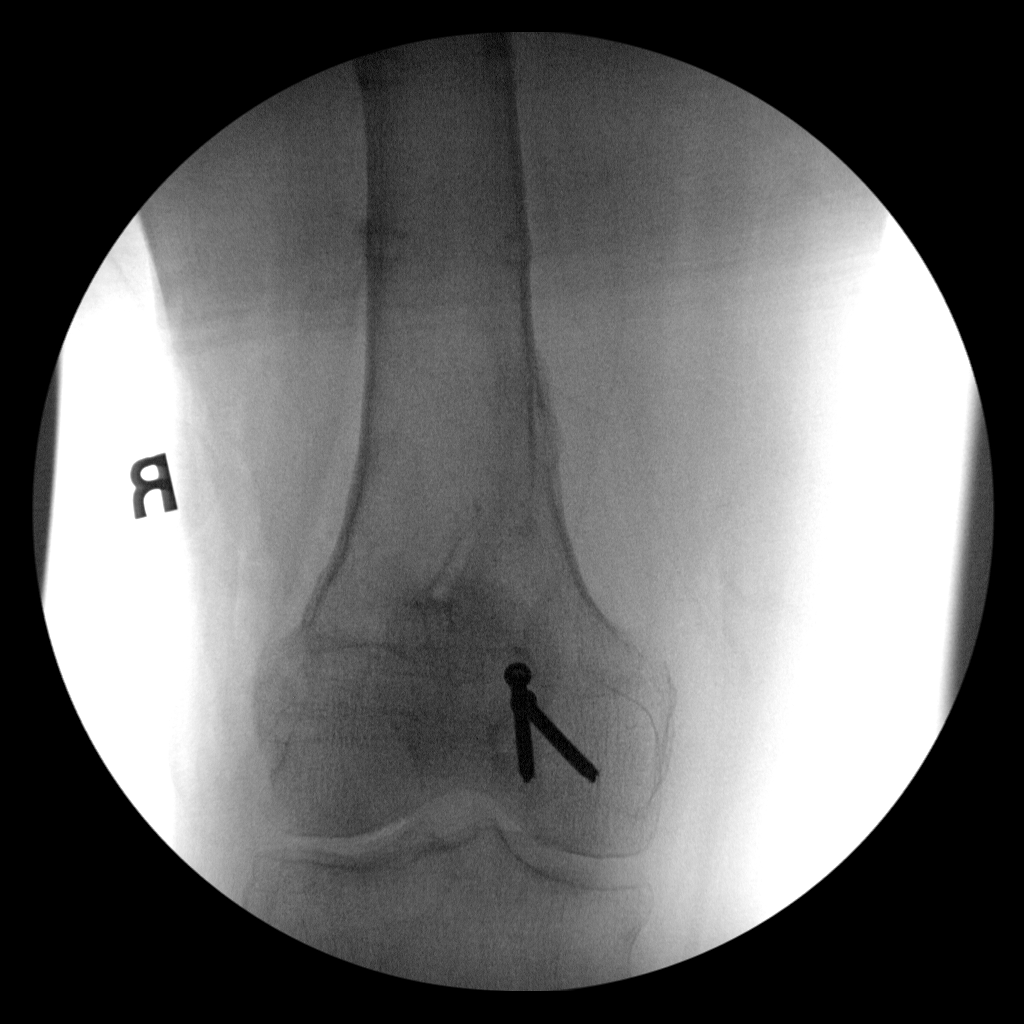

[2 of 2 positions shown; findings below may reference images not displayed]

FINDINGS: Surgical screws in the distal right femur. Postsurgical defects also
noted in the distal right femur consistent with hardware removal.
Hardware intact. Anatomic alignment. Diffuse degenerative change.
IMPRESSION: Postsurgical changes right femur.

## 2020-07-16 IMAGING — RF DG C-ARM 1-60 MIN
1 series · 2 of 2 positions shown · IV contrast (agent unspecified)
Comparison: No prior.

CLINICAL DATA: Hardware removal.

EXAM:
DG C-ARM 1-60 MIN; RIGHT KNEE - 1-2 VIEW
CONTRAST:  None.
FLUOROSCOPY TIME:  Fluoroscopy Time:  0 minutes 20 seconds
Number of Acquired Spot Images: 2

[Series 1: run · 2 of 2 slices shown]
[im 1/2]
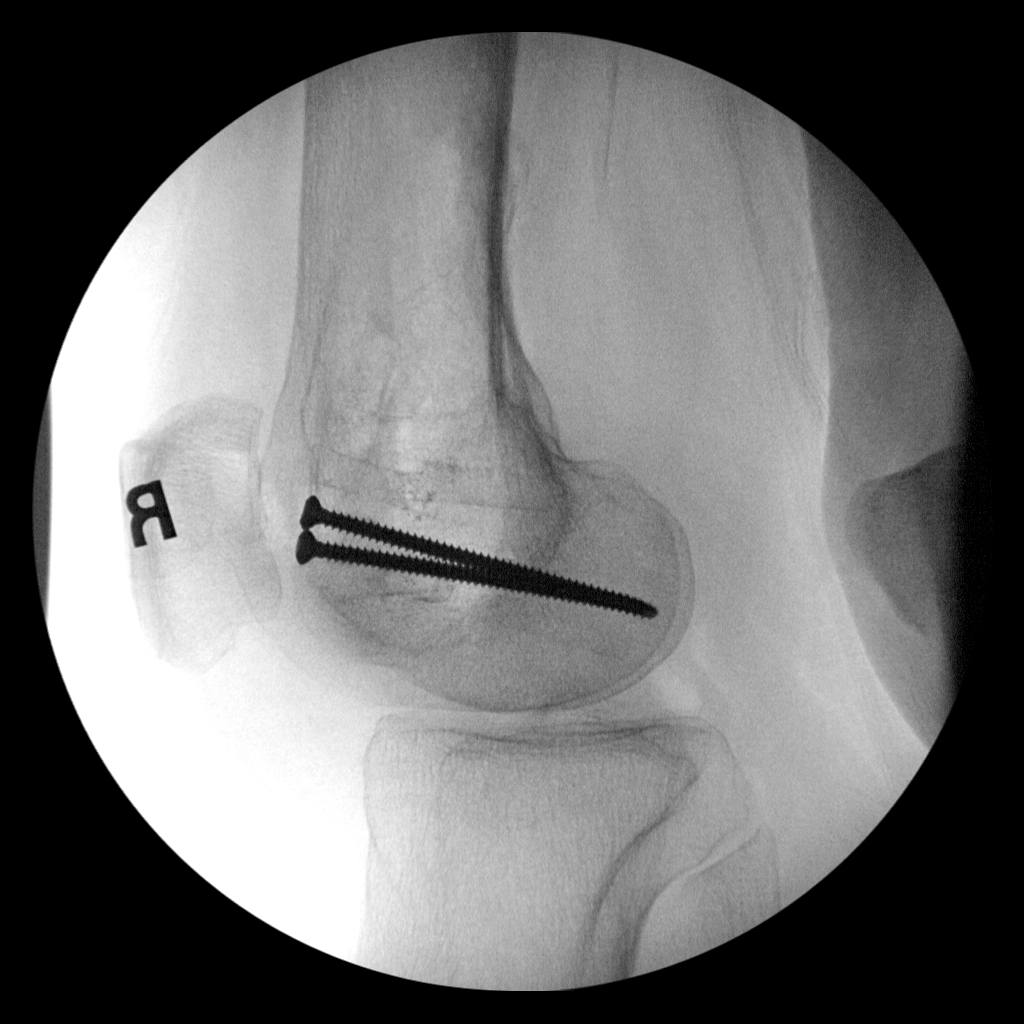
[im 2/2]
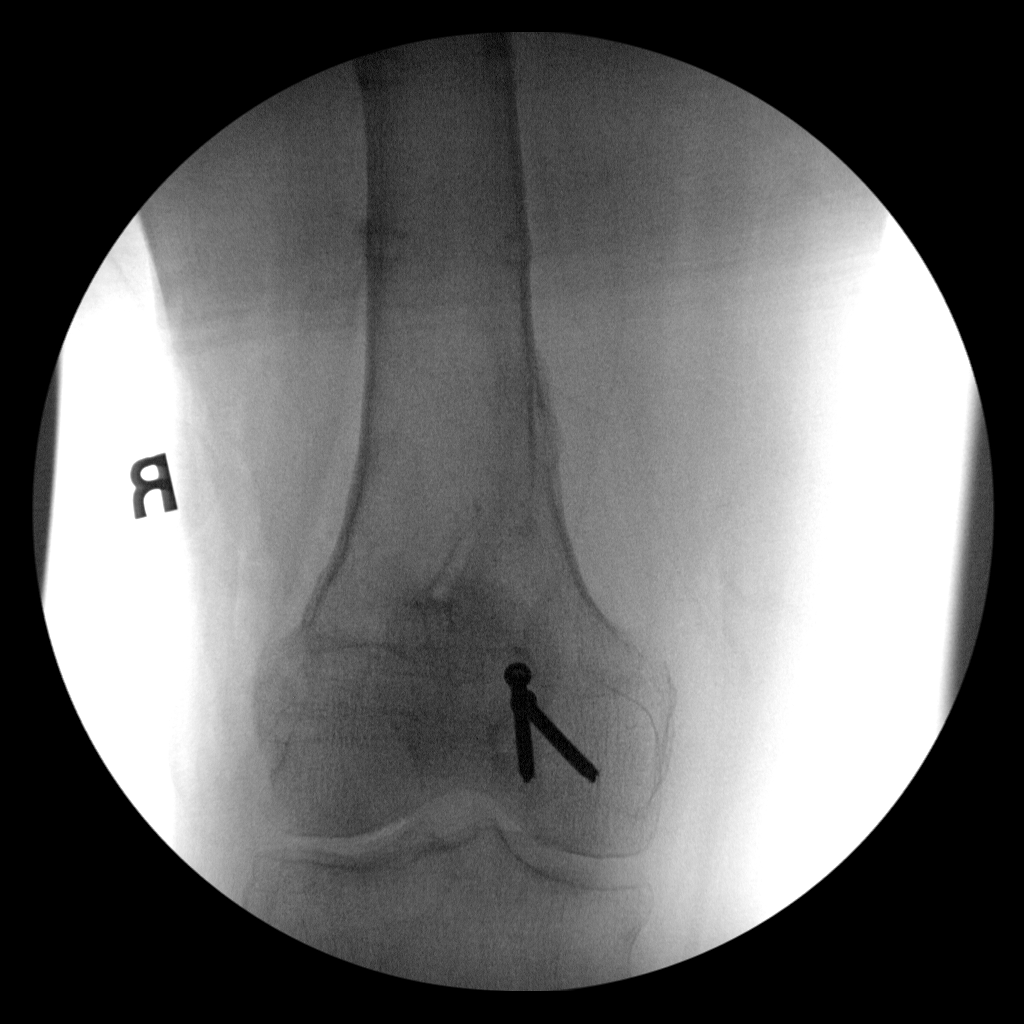

[2 of 2 positions shown; findings below may reference images not displayed]

FINDINGS: Surgical screws in the distal right femur. Postsurgical defects also
noted in the distal right femur consistent with hardware removal.
Hardware intact. Anatomic alignment. Diffuse degenerative change.
IMPRESSION: Postsurgical changes right femur.

## 2020-07-19 IMAGING — CR DG KNEE COMPLETE 4+V*R*
4 series · 4 of 4 positions shown · non-contrast
Comparison: Right knee radiographs 06/28/2019 she

CLINICAL DATA: Pt states he had hardware removed from his femur 3
days ago and is having more swelling and pain than anticipated. Pt
states his knee and leg are swollen and painful from the knee up to
mid thigh.

EXAM:
RIGHT KNEE - COMPLETE 4+ VIEW

[knee ap]
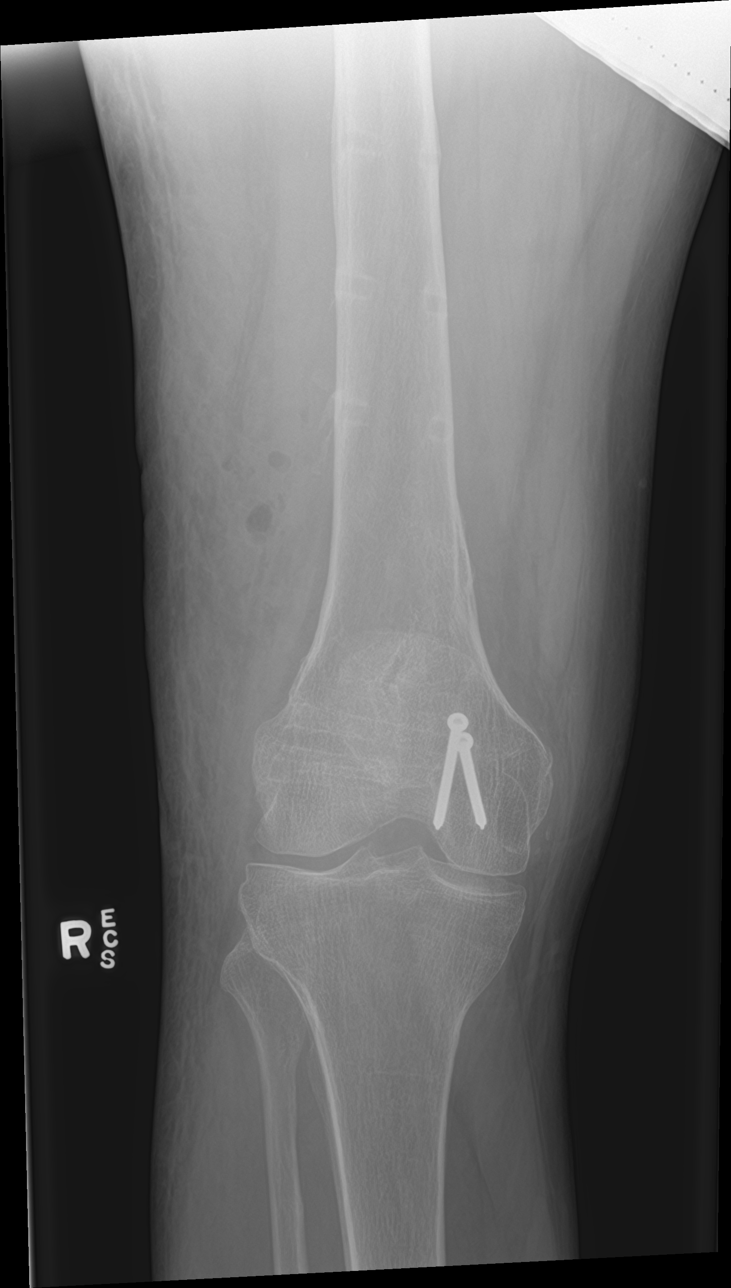

[knee lat]
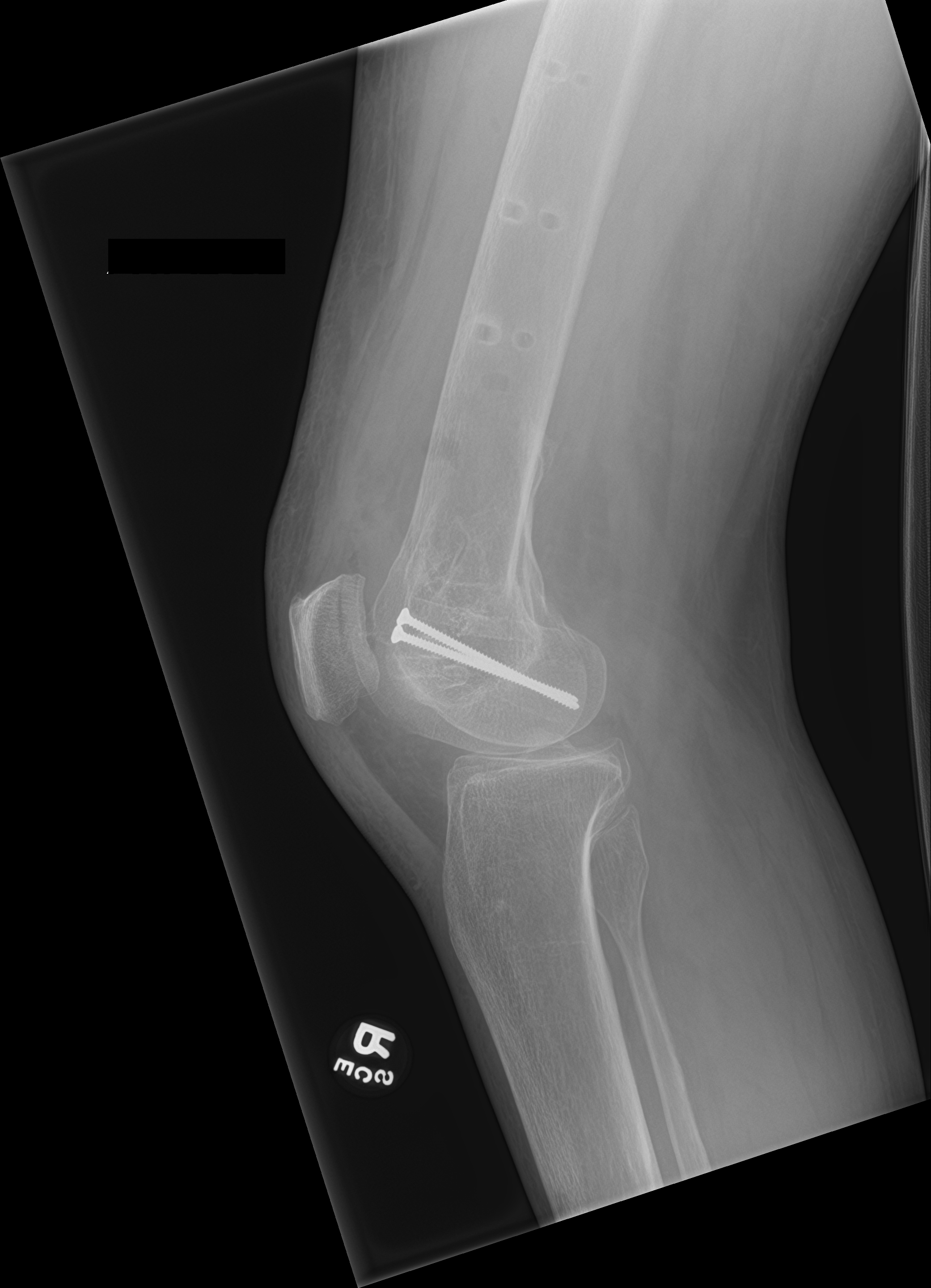

[knee obl (1 of 2)]
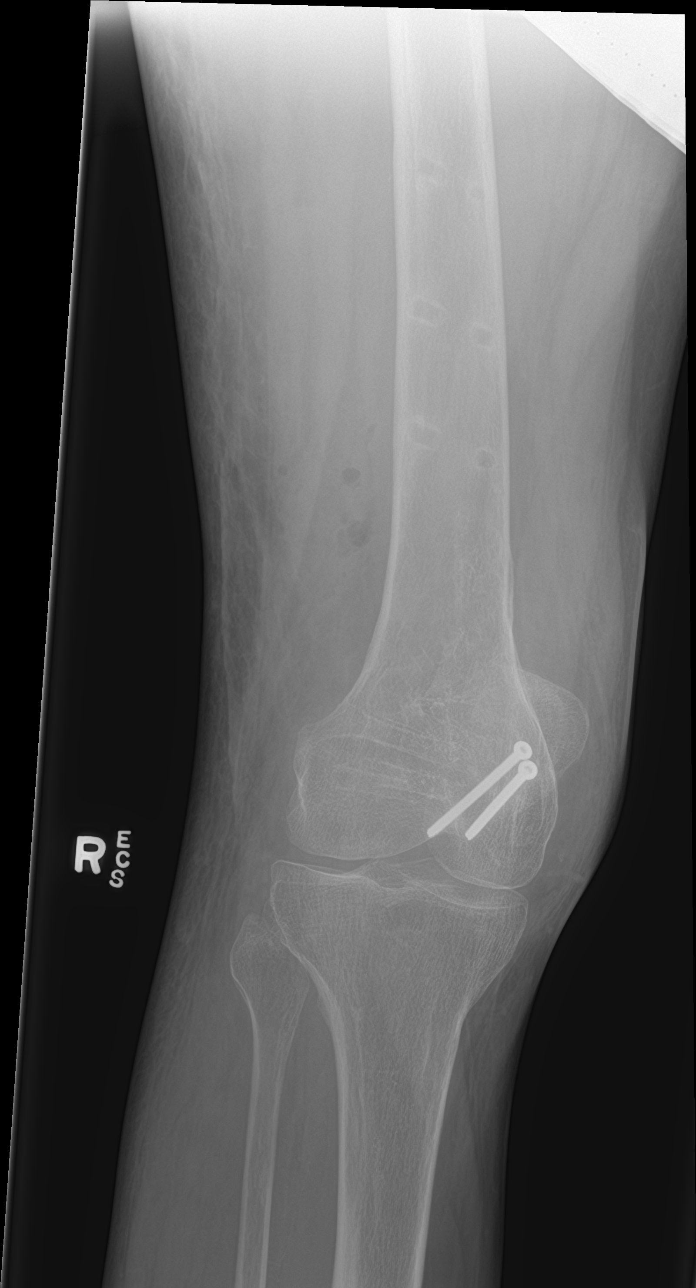

[knee obl (2 of 2)]
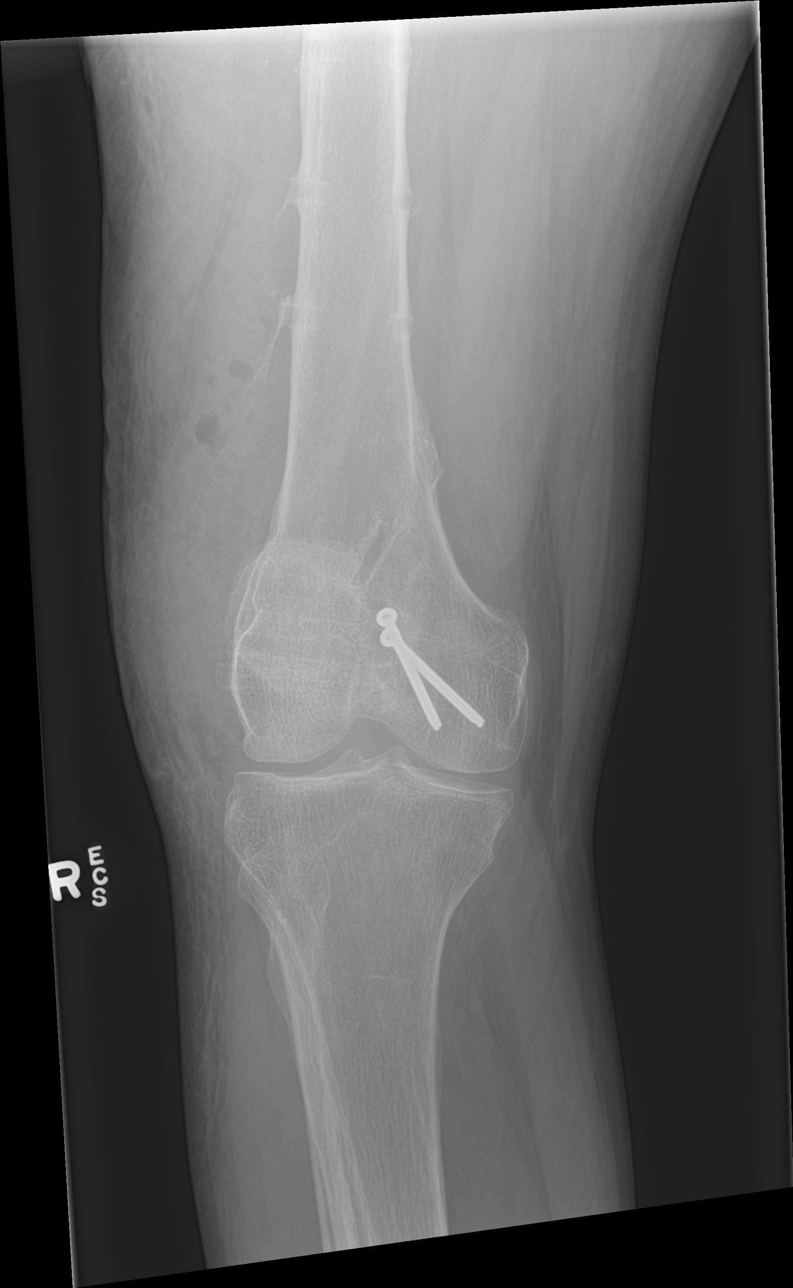

[4 of 4 positions shown; findings below may reference images not displayed]

FINDINGS: Two screws are again noted in the medial femoral condyle. Old screw
tracks noted in the distal femur. Increased swelling noted in the
right lateral soft tissues. Minimal amount of postoperative gas
remains. There is no evidence of acute fracture or dislocation.
Trace joint effusion.
IMPRESSION: Increased swelling in the right lateral thigh soft tissues with
minimal amount of postoperative gas remaining. No acute osseous
abnormality identified.
